# Patient Record
Sex: Male | Born: 1970 | Race: White | Hispanic: No | Marital: Married
Health system: Southern US, Community
[De-identification: ages and names within clinical notes are randomized; demographics above are authoritative.]

---

## 2009-01-12 ENCOUNTER — Ambulatory Visit (HOSPITAL_COMMUNITY): Admission: RE | Admit: 2009-01-12 | Discharge: 2009-01-13 | Payer: Self-pay | Admitting: Neurosurgery

## 2010-08-31 LAB — CBC
Hemoglobin: 13.7 g/dL (ref 13.0–17.0)
MCHC: 35 g/dL (ref 30.0–36.0)
Platelets: 282 10*3/uL (ref 150–400)
RDW: 12.4 % (ref 11.5–15.5)

## 2010-10-08 NOTE — Op Note (Signed)
NAMEBASSEL, GASKILL                ACCOUNT NO.:  [id number]   MEDICAL RECORD NO.:  [id number]          PATIENT TYPE:  OIB   LOCATION:  3533                         FACILITY:  [facility]   PHYSICIAN:  Hilda Lias, M.D.   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   PROCEDURE:  Left 4-5 diskectomy, decompression of the thecal sac, and  decompression of the L4-L5 nerve root.  Microscope.   SURGEON:  Hilda Lias, MD   ASSISTANT:  Danae Orleans. Venetia Maxon, MD   CLINICAL HISTORY:  Mr. Mau had been complaining of back pain, worse  in the left leg for several months.  He is not any better.  X-rays  showed herniated disk at the L4-5 to the left.  He had degenerative disk  disease at 5-1.  Surgery was advised.   PROCEDURE:  The patient was taken to the OR and he was positioned on  prone manner.  The back was cleaned with DuraPrep.  X-rays showed that  we were at the level of L4-5.  From there on, a midline incision between  4 and 5 was made and muscles were retracted laterally.  We identified  the L4-L5 space.  We brought the microscope.  We did a laminotomy at 4-5  with removal of the yellow ligament.  Indeed, we found that the L5 nerve  root was swollen at this place.  Retraction was done.  There was a large  piece of disk.  Incision was made.  __________ were removed.  The disk  was open.  We entered to the disk space where total gross diskectomy was  achieved.  Foraminotomy was done.  At the end, we had a good  decompression of the thecal sac as well as the L4-5 nerve root.  Valsalva maneuver was negative.  Then fentanyl and Depo-Medrol were left  in the epidural space and the wound was closed with Vicryl and Steri-  Strips.           ______________________________  Hilda Lias, M.D.     EB/MEDQ  D:  01/12/2009  T:  01/13/2009  Job:  [id number]

## 2014-06-09 ENCOUNTER — Other Ambulatory Visit: Payer: Self-pay | Admitting: Neurosurgery

## 2014-06-09 DIAGNOSIS — M5416 Radiculopathy, lumbar region: Secondary | ICD-10-CM

## 2014-06-13 ENCOUNTER — Ambulatory Visit
Admission: RE | Admit: 2014-06-13 | Discharge: 2014-06-13 | Disposition: A | Discharge status: Home / Self Care | Payer: BLUE CROSS/BLUE SHIELD | Source: Ambulatory Visit | Attending: Neurosurgery | Admitting: Neurosurgery

## 2014-06-13 ENCOUNTER — Telehealth: Payer: Self-pay

## 2014-06-13 DIAGNOSIS — M5416 Radiculopathy, lumbar region: Secondary | ICD-10-CM

## 2014-06-13 MED ORDER — DIAZEPAM 5 MG PO TABS
10.0000 mg | ORAL_TABLET | Freq: Once | ORAL | Status: AC
  Administered 2014-06-13: 10 mg via ORAL

## 2014-06-13 MED ORDER — IOHEXOL 180 MG/ML  SOLN
15.0000 mL | Freq: Once | INTRAMUSCULAR | Status: AC | PRN
Start: 2014-06-13 — End: 2014-06-13
  Administered 2014-06-13: 15 mL via INTRATHECAL

## 2014-06-13 NOTE — Discharge Instructions (Signed)

## 2014-06-13 NOTE — Progress Notes (Signed)
I spoke with Brandi with Dr. Jeral FruitBotero to ask if he would prescribe some Valium for Mr. Maxwell Velazquez (his request) to get him through the 24 hours of bedrest he's facing with the myelogram he is having now.  She stated she would pass the message to Dr. Jeral FruitBotero and call Mr. Maxwell Velazquez if Dr. Wayland DenisBotero okays it.  She would phone it in to his pharmacy.  jkl

## 2015-09-17 IMAGING — CT CT L SPINE W/ CM
4 of 11 series · 13 of 33 positions shown, 14 images · non-contrast
Comparison: MRI lumbar spine 04/11/2014.

CLINICAL DATA: Low back pain. LEFT leg pain. Previous lumbar
surgery.
TECHNIQUE: Contiguous axial images were obtained through the Lumbar spine after
the intrathecal infusion of infusion. Coronal and sagittal
reconstructions were obtained of the axial image sets.

[Series 2: l spine bone · axial · 0.27mm/px · z∈[-426,-153]mm · 3 of 110 slices shown, 4 images]
[im 1/110  soft-tissue]
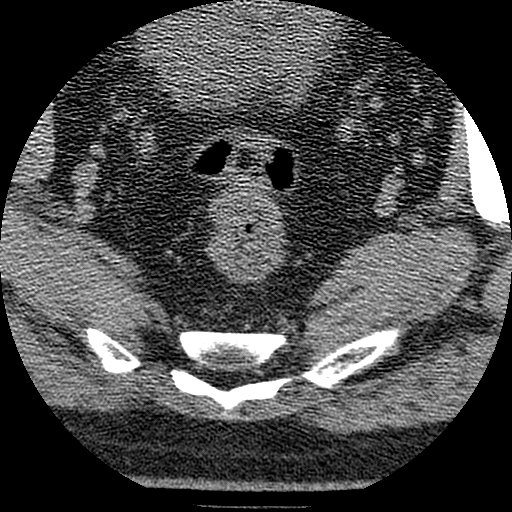
[im 1/110  bone]
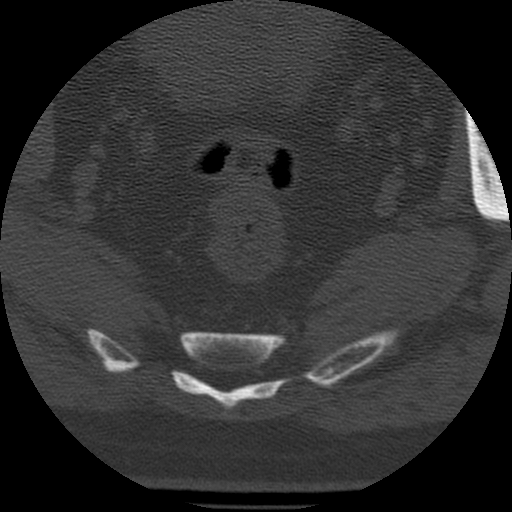
[im 55/110  bone]
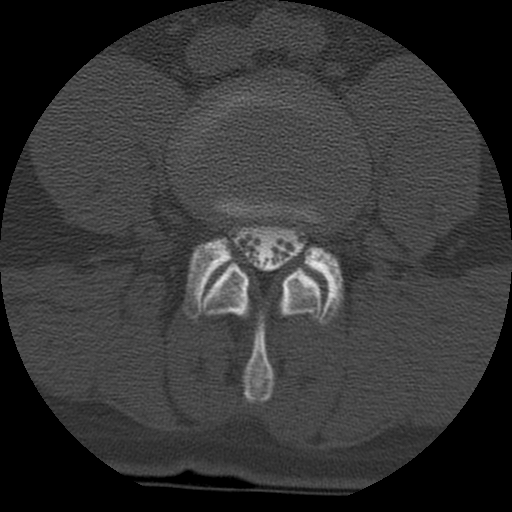
[im 110/110  bone]
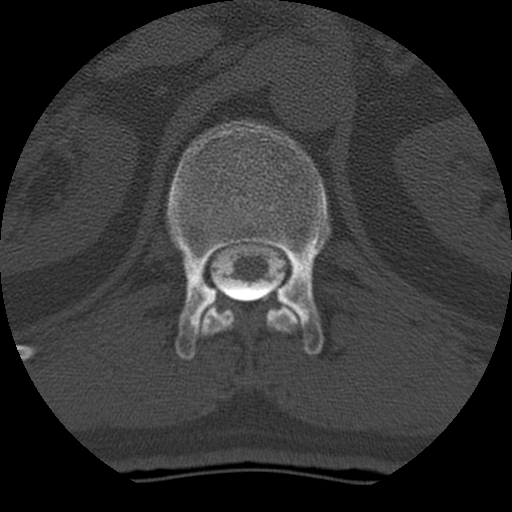

[Series 3: l spine soft · axial · 0.27mm/px · z∈[-336,-246]mm · 2 of 110 slices shown]
[im 37/110  soft-tissue]
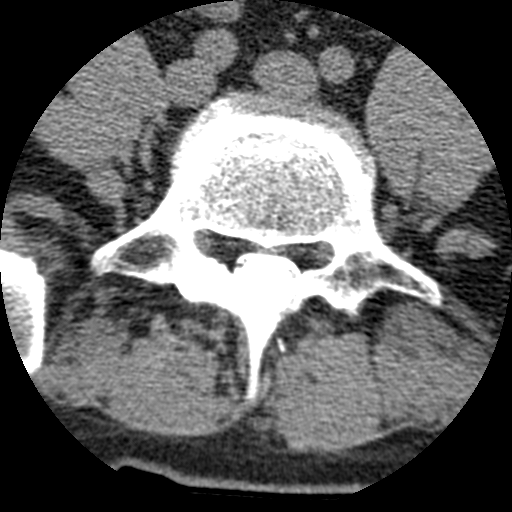
[im 73/110  soft-tissue]
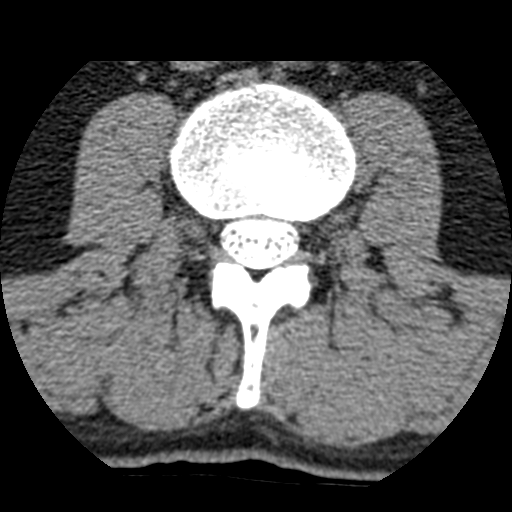

[Series 401: cor lower · coronal · 0.54mm/px · 3 of 54 slices shown]
[im 11/54  bone]
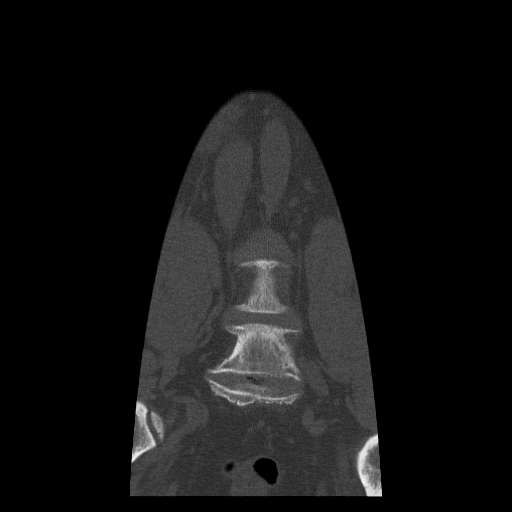
[im 22/54  bone]
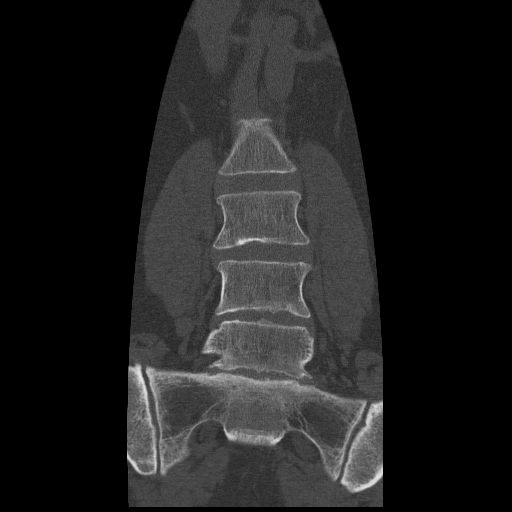
[im 32/54  bone]
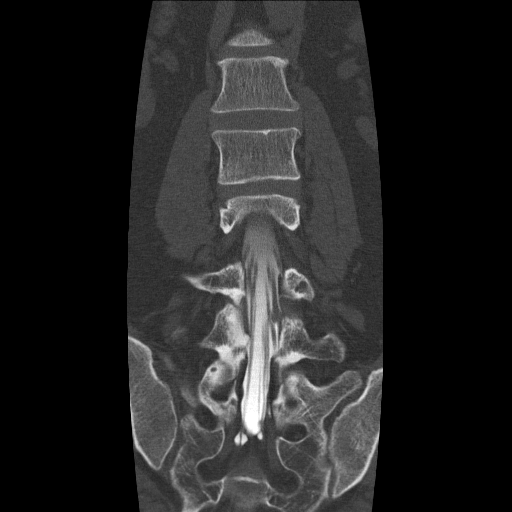

[Series 402: sag · sagittal · 0.54mm/px · 5 of 69 slices shown]
[im 12/69  bone]
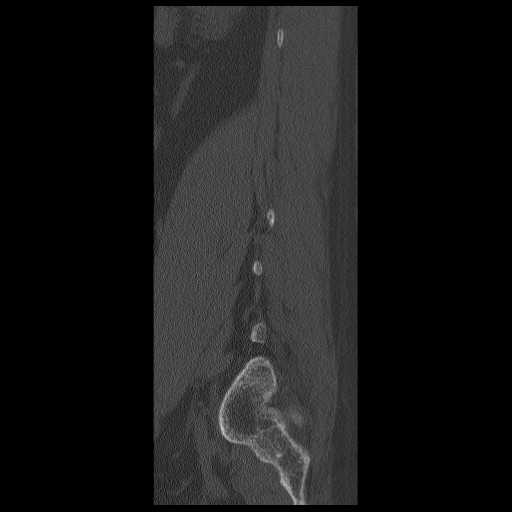
[im 23/69  bone]
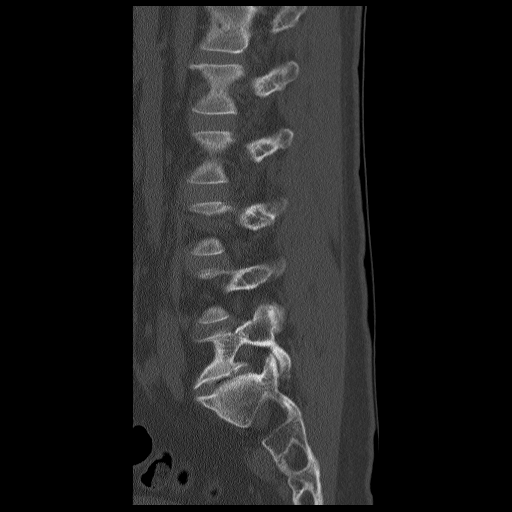
[im 35/69  bone]
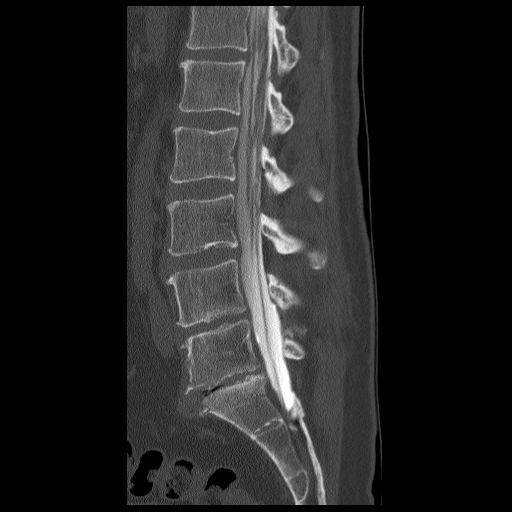
[im 46/69  bone]
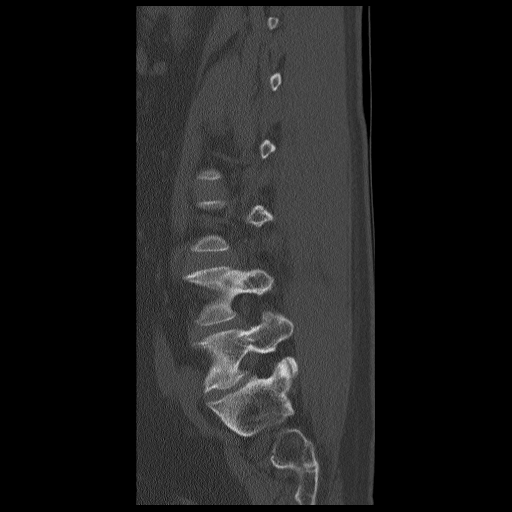
[im 57/69  bone]
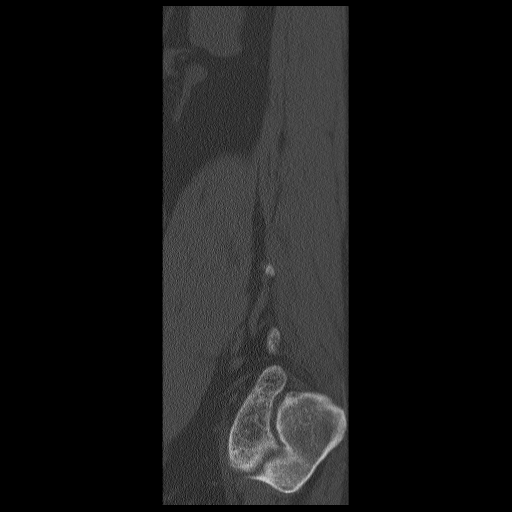

[13 of 33 positions shown; findings below may reference images not displayed]

EXAM:
LUMBAR MYELOGRAM

FLUOROSCOPY TIME:  48 seconds

PROCEDURE:
After thorough discussion of risks and benefits of the procedure
including bleeding, infection, injury to nerves, blood vessels,
adjacent structures as well as headache and CSF leak, written and
oral informed consent was obtained. Consent was obtained by Dr. Xue
Kiszlinger. Time out form was completed.

Patient was positioned prone on the fluoroscopy table. Local
anesthesia was provided with 1% lidocaine without epinephrine after
prepped and draped in the usual sterile fashion. Puncture was
performed at L4-5 using a 3 1/2 inch 22-gauge spinal needle via
midline approach. Using a single pass through the dura, the needle
was placed within the thecal sac, with return of clear CSF. 15 mL of
8mnipaque-KEH was injected into the thecal sac, with normal
opacification of the nerve roots and cauda equina consistent with
free flow within the subarachnoid space.

I personally performed the lumbar puncture and administered the
intrathecal contrast. I also personally supervised acquisition of
the myelogram images.
FINDINGS: LUMBAR MYELOGRAM FINDINGS:

Good opacification lumbar subarachnoid space. Advanced disc space
narrowing at L5-S1. Mild-to-moderate disc space narrowing at L4-5.
There is truncation of the LEFT L5 nerve root in the lateral recess.
Mild underfilling of both S1 nerve roots without frank nerve root
cut off. Shallow ventral defect at L3-4. Anatomic alignment is
maintained with patient standing through flexion and extension.

CT LUMBAR MYELOGRAM FINDINGS:

Segmentation: Normal.

Alignment:  Normal.

Vertebrae: No worrisome osseous lesion.

Conus medullaris: Normal in size, and location

Paraspinal tissues: No evidence for hydronephrosis or paravertebral
mass.

Disc levels:

L1-L2:  Normal

L2-L3:  Normal

L3-L4:  Mild bulge.  No impingement.

L4-L5: Small hemilaminotomy defect on the LEFT mild facet
arthropathy. Recurrent disc extrusion on the LEFT projects into the
axilla of the nerve root sleeve. There is compression of LEFT L5
nerve root. The features do not resemble epidural fibrosis, given
the mass effect on the nerve root sleeve.

L5-S1: Central disc osteophyte complex with moderate to severe disc
space narrowing. Mild facet arthropathy. No subarticular zone or
foraminal zone narrowing. Mild facet arthropathy.
IMPRESSION: LUMBAR MYELOGRAM IMPRESSION:

Truncation of the LEFT L5 nerve root sleeve. No dynamic instability.

CT LUMBAR MYELOGRAM IMPRESSION:

Recurrent disc extrusion on the LEFT at L5-S1 projects into the
axilla of the L5 nerve root.

Moderate to severe disc space narrowing at L5-S1.  No impingement.
